# Patient Record
Sex: Female | Born: 1975 | Race: Asian | Hispanic: No | Marital: Married | Smoking: Never smoker
Health system: Southern US, Community
[De-identification: ages and names within clinical notes are randomized; demographics above are authoritative.]

## PROBLEM LIST (undated history)

## (undated) DIAGNOSIS — N200 Calculus of kidney: Secondary | ICD-10-CM

---

## 2007-05-23 ENCOUNTER — Emergency Department: Payer: Self-pay | Admitting: Unknown Physician Specialty

## 2008-09-06 ENCOUNTER — Emergency Department: Payer: Self-pay | Admitting: Emergency Medicine

## 2009-07-20 ENCOUNTER — Emergency Department: Payer: Self-pay | Admitting: Emergency Medicine

## 2009-09-18 ENCOUNTER — Emergency Department: Payer: Self-pay | Admitting: Emergency Medicine

## 2009-11-10 ENCOUNTER — Ambulatory Visit: Payer: Self-pay | Admitting: Urology

## 2010-08-24 ENCOUNTER — Ambulatory Visit: Payer: Self-pay | Admitting: Urology

## 2011-08-30 ENCOUNTER — Ambulatory Visit: Payer: Self-pay | Admitting: Urology

## 2012-05-10 ENCOUNTER — Ambulatory Visit: Payer: Self-pay | Admitting: Urology

## 2013-03-08 ENCOUNTER — Observation Stay: Payer: Self-pay | Admitting: Obstetrics and Gynecology

## 2013-03-09 LAB — URINALYSIS, COMPLETE
Bacteria: NONE SEEN
Bilirubin,UR: NEGATIVE
Ph: 7 (ref 4.5–8.0)
RBC,UR: <1 /HPF (ref 0–5)
Squamous Epithelial: 1
WBC UR: 9 /HPF (ref 0–5)

## 2013-04-01 ENCOUNTER — Inpatient Hospital Stay: Payer: Self-pay

## 2013-04-01 LAB — CBC WITH DIFFERENTIAL/PLATELET
Basophil %: 0.3 %
Eosinophil %: 0.5 %
HCT: 37.8 % (ref 35.0–47.0)
MCH: 29.1 pg (ref 26.0–34.0)
MCHC: 33.6 g/dL (ref 32.0–36.0)
MCV: 87 fL (ref 80–100)
Monocyte #: 0.7 x10 3/mm (ref 0.2–0.9)
Monocyte %: 8.6 %
Neutrophil #: 6.2 10*3/uL (ref 1.4–6.5)
Platelet: 203 10*3/uL (ref 150–440)
WBC: 8.5 10*3/uL (ref 3.6–11.0)

## 2013-04-02 LAB — HEMATOCRIT: HCT: 35.1 % (ref 35.0–47.0)

## 2015-04-20 NOTE — H&P (Signed)
L&D Evaluation:  History Expanded:  HPI 39 yo G1 w estimated date of confinement 5/16/1 at 36 weeks w spontaneous rupture of membranes at 0410 today, mild-medium contractions.  No vb.  [facility]   Gravida 1   Term 0   PreTerm 0   Abortion 0   Living 0   Blood Type (Maternal) B positive   Group B Strep Results Maternal (Result >5wks must be treated as unknown) unknown/result > 5 weeks ago   Presents with leaking fluid   Patient's Medical History No Chronic Illness   Patient's Surgical History none   Medications Pre Natal Vitamins   Allergies NKDA   Social History none   Family History Non-Contributory   ROS:  ROS All systems were reviewed.  HEENT, CNS, GI, GU, Respiratory, CV, Renal and Musculoskeletal systems were found to be normal.   Exam:  Vital Signs stable   General no apparent distress   Mental Status clear   Chest clear   Heart normal sinus rhythm   Abdomen gravid, tender with contractions   Estimated Fetal Weight Average for gestational age   Back no CVAT   Edema no edema   Pelvic no external lesions, 5/100/-1   Mebranes Ruptured   Description clear   FHT normal rate with no decels   Ucx regular   Skin dry   Impression:  Impression early labor, w spontaneous rupture of membranes   Plan:  Plan EFM/NST, monitor contractions and for cervical change, antibiotics for GBBS prophylaxis   Comments Patient has been counseled as to the risks of prematurity, including risks of respiratory depression or distress, jaundice, feeding or temperature regulation problems, neurologic concerns including hearing or visual problems, and brain complications.  Patient understands the risks of tocolytic therapies along with their inherent failure rates, and the reasons for and against their use in individual circumstances.   Electronic Signatures: Letitia LibraHarris, Jastin Fore Paul (MD)  (Signed 22-Apr-14 07:16)  Authored: L&D Evaluation   Last Updated:  22-Apr-14 07:16 by Letitia LibraHarris, Tyquon Near Paul (MD)

## 2015-04-20 NOTE — H&P (Signed)
L&D Evaluation:  History:  HPI 39 [facility] presenting with painless vaginal bleeding that started today.  She felt something wet and noted several small bright red smears of blood.  She was evaluated in clinic approximately 2 weeks ago also noting some dark red bleeding/discharge at that time.   No trauma or preceeding intercourse. She states she has felt intermitten contractions since 03/05/2013.  +FM, no LOF.  PNC noteable for AMA and early entry to care.  PNL B+, ABSC neg, RI, VZI, HIV neg, RPR NR, HBsAg neg, 1-hr Glucola 149 (3-hr with 1 of 4 values abnormal).  20 week anatomy US showing anterior placenta, not low lying.   Presents with vaginal bleeding   Patient's Medical History nephrolithiasis   Patient's Surgical History none   Medications Pre Natal Vitamins   Allergies NKDA   Social History none   Family History Non-Contributory   ROS:  ROS All systems were reviewed.  HEENT, CNS, GI, GU, Respiratory, CV, Renal and Musculoskeletal systems were found to be normal.   Exam:  Vital Signs stable   Urine Protein not completed, UA pending   General no apparent distress   Mental Status clear   Chest no increased work of breathing   Abdomen gravid, non-tender   Estimated Fetal Weight Average for gestational age   Fetal Position vtx   Back no CVAT   Edema no edema   Pelvic no external lesions, cervix closed and thick, dark brown blood in vaginal vault approximately 1 scopets worth, no blood on perineum   Mebranes Intact   FHT normal rate with no decels, reactive tracing   Ucx regular, q642min   Impression:  Impression Vaginal bleeding, contracting at 759w1d   Plan:  Plan UA, EFM/NST, monitor contractions and for cervical change   Comments 1) Contractions - tocolysis with terbutaline 250mcg SQ once.  Will recheck in 1-2hrs.  If any evidence of cervical change will adminsiter BMZ for lung maturity and start ampicillin for GBS unknown.   Given gestational age >32 weeks no magnesium sulfate for CP prophylaxis.  2) Vaginal bleeding - painless, appears to be old blood.  Low concern for abruption, given lack of pain and reassuring fetal surveillance.  Also no evidence of previa on prior US.  I do have some concern that in the setting of contractions this may be secondary to cervical effacement or dilation.  Continuous fetal monitoring.  Will likely keep overnight for prolonged monitoring.   Electronic Signatures: Lorrene ReidStaebler, Dandy Lazaro M (MD)  (Signed 29-Mar-14 23:35)  Authored: L&D Evaluation   Last Updated: 29-Mar-14 23:35 by Lorrene ReidStaebler, Arabell Neria M (MD)

## 2019-12-17 ENCOUNTER — Ambulatory Visit: Payer: BC Managed Care – PPO | Attending: Internal Medicine

## 2019-12-17 DIAGNOSIS — Z20822 Contact with and (suspected) exposure to covid-19: Secondary | ICD-10-CM

## 2019-12-18 LAB — NOVEL CORONAVIRUS, NAA: SARS-CoV-2, NAA: NOT DETECTED

## 2021-05-03 ENCOUNTER — Emergency Department: Payer: BC Managed Care – PPO

## 2021-05-03 ENCOUNTER — Other Ambulatory Visit: Payer: Self-pay

## 2021-05-03 ENCOUNTER — Emergency Department
Admission: EM | Admit: 2021-05-03 | Discharge: 2021-05-03 | Disposition: A | Discharge status: Home / Self Care | Payer: BC Managed Care – PPO | Attending: Emergency Medicine | Admitting: Emergency Medicine

## 2021-05-03 ENCOUNTER — Encounter: Payer: Self-pay | Admitting: Emergency Medicine

## 2021-05-03 DIAGNOSIS — N200 Calculus of kidney: Secondary | ICD-10-CM | POA: Insufficient documentation

## 2021-05-03 DIAGNOSIS — R109 Unspecified abdominal pain: Secondary | ICD-10-CM

## 2021-05-03 HISTORY — DX: Calculus of kidney: N20.0

## 2021-05-03 LAB — COMPREHENSIVE METABOLIC PANEL
ALT: 26 U/L (ref 0–44)
AST: 20 U/L (ref 15–41)
Albumin: 3.7 g/dL (ref 3.5–5.0)
Alkaline Phosphatase: 49 U/L (ref 38–126)
Anion gap: 9 (ref 5–15)
BUN: 11 mg/dL (ref 6–20)
CO2: 22 mmol/L (ref 22–32)
Calcium: 8.8 mg/dL — ABNORMAL LOW (ref 8.9–10.3)
Chloride: 103 mmol/L (ref 98–111)
Creatinine, Ser: 1.53 mg/dL — ABNORMAL HIGH (ref 0.44–1.00)
GFR, Estimated: 43 mL/min — ABNORMAL LOW (ref >60)
Glucose, Bld: 103 mg/dL — ABNORMAL HIGH (ref 70–99)
Potassium: 3.6 mmol/L (ref 3.5–5.1)
Sodium: 134 mmol/L — ABNORMAL LOW (ref 135–145)
Total Bilirubin: 1.1 mg/dL (ref 0.3–1.2)
Total Protein: 7.2 g/dL (ref 6.5–8.1)

## 2021-05-03 LAB — URINALYSIS, COMPLETE (UACMP) WITH MICROSCOPIC
Bilirubin Urine: NEGATIVE
Glucose, UA: NEGATIVE mg/dL
Ketones, ur: 5 mg/dL — AB
Leukocytes,Ua: NEGATIVE
Nitrite: NEGATIVE
Protein, ur: NEGATIVE mg/dL
Specific Gravity, Urine: 1.004 — ABNORMAL LOW (ref 1.005–1.030)
pH: 6 (ref 5.0–8.0)

## 2021-05-03 LAB — CBC
HCT: 39.9 % (ref 36.0–46.0)
Hemoglobin: 13.7 g/dL (ref 12.0–15.0)
MCH: 29.1 pg (ref 26.0–34.0)
MCHC: 34.3 g/dL (ref 30.0–36.0)
MCV: 84.7 fL (ref 80.0–100.0)
Platelets: 264 10*3/uL (ref 150–400)
RBC: 4.71 MIL/uL (ref 3.87–5.11)
RDW: 12.8 % (ref 11.5–15.5)
WBC: 13.5 10*3/uL — ABNORMAL HIGH (ref 4.0–10.5)
nRBC: 0 % (ref 0.0–0.2)

## 2021-05-03 LAB — LIPASE, BLOOD: Lipase: 41 U/L (ref 11–51)

## 2021-05-03 LAB — POC URINE PREG, ED: Preg Test, Ur: NEGATIVE

## 2021-05-03 MED ORDER — ONDANSETRON 4 MG PO TBDP: 4.0000 mg | ORAL_TABLET | Freq: Three times a day (TID) | ORAL | 0 refills | Status: DC | PRN

## 2021-05-03 MED ORDER — SODIUM CHLORIDE 0.9 % IV BOLUS
1000.0000 mL | Freq: Once | INTRAVENOUS | Status: AC
  Administered 2021-05-03: 1000 mL via INTRAVENOUS

## 2021-05-03 MED ORDER — ONDANSETRON 4 MG PO TBDP
ORAL_TABLET | ORAL | Status: AC
  Filled 2021-05-03: qty 1

## 2021-05-03 MED ORDER — ONDANSETRON HCL 4 MG/2ML IJ SOLN
INTRAMUSCULAR | Status: AC
  Administered 2021-05-03: 4 mg via INTRAVENOUS
  Filled 2021-05-03: qty 2

## 2021-05-03 MED ORDER — FENTANYL CITRATE (PF) 100 MCG/2ML IJ SOLN
INTRAMUSCULAR | Status: AC
  Administered 2021-05-03: 50 ug via INTRAVENOUS
  Filled 2021-05-03: qty 2

## 2021-05-03 MED ORDER — OXYCODONE-ACETAMINOPHEN 5-325 MG PO TABS
1.0000 | ORAL_TABLET | Freq: Once | ORAL | Status: AC
  Administered 2021-05-03: 1 via ORAL
  Filled 2021-05-03: qty 1

## 2021-05-03 MED ORDER — FENTANYL CITRATE (PF) 100 MCG/2ML IJ SOLN
50.0000 ug | INTRAMUSCULAR | Status: AC | PRN
Start: 2021-05-03 — End: 2021-05-03
  Administered 2021-05-03: 50 ug via INTRAVENOUS

## 2021-05-03 MED ORDER — OXYCODONE-ACETAMINOPHEN 5-325 MG PO TABS: 1.0000 | ORAL_TABLET | ORAL | 0 refills | Status: DC | PRN

## 2021-05-03 MED ORDER — ONDANSETRON 4 MG PO TBDP
4.0000 mg | ORAL_TABLET | Freq: Once | ORAL | Status: AC
  Administered 2021-05-03: 4 mg via ORAL

## 2021-05-03 MED ORDER — ONDANSETRON HCL 4 MG/2ML IJ SOLN: 4.0000 mg | Freq: Once | INTRAMUSCULAR | Status: AC

## 2021-05-03 NOTE — ED Triage Notes (Signed)
C/O left flank pain since last night.  States pain left flank to LLQ and left lower back.  Denies dysuria.  Also c/o nausea

## 2021-05-03 NOTE — ED Provider Notes (Signed)
[facility] Emergency Department Provider Note   ____________________________________________   Event Date/Time   First MD Initiated Contact with Patient 05/03/21 1803     (approximate)  I have reviewed the triage vital signs and the nursing notes.   HISTORY  Chief Complaint Flank Pain    HPI Nichole Tapia is a 45 y.o. female with past medical history of kidney stones who presents to the ED complaining of flank pain.  Patient reports that she developed sharp pain in her left flank yesterday morning that has been present intermittently since then.  It radiates towards the left lower quadrant of her abdomen but is not exacerbated or alleviated by anything in particular.  She has felt nauseous and vomited a couple of times but denies any changes in her bowel movements.  She has not had any fevers, dysuria, or hematuria.  She states she was diagnosed with kidney stones many years ago but has not had any issues since then.  She has been taking ibuprofen with partial relief of her pain.        Past Medical History:  Diagnosis Date  . Kidney stones     There are no problems to display for this patient.   History reviewed. No pertinent surgical history.  Prior to Admission medications   Not on File    Allergies Amoxicillin  No family history on file.  Social History    Review of Systems  Constitutional: No fever/chills Eyes: No visual changes. ENT: No sore throat. Cardiovascular: Denies chest pain. Respiratory: Denies shortness of breath. Gastrointestinal: Positive for flank and abdominal pain.  Positive for nausea and vomiting.  No diarrhea.  No constipation. Genitourinary: Negative for dysuria. Musculoskeletal: Negative for back pain. Skin: Negative for rash. Neurological: Negative for headaches, focal weakness or numbness.  ____________________________________________   PHYSICAL EXAM:  VITAL SIGNS: ED Triage Vitals  Enc Vitals  Group     BP 05/03/21 1447 102/78     Pulse Rate 05/03/21 1447 93     Resp 05/03/21 1447 16     Temp 05/03/21 1447 98.7 F (37.1 C)     Temp Source 05/03/21 1447 Oral     SpO2 05/03/21 1447 100 %     Weight 05/03/21 1446 153 lb (69.4 kg)     Height 05/03/21 1446 5' (1.524 m)     Head Circumference --      Peak Flow --      Pain Score 05/03/21 1445 9     Pain Loc --      Pain Edu? --      Excl. in GC? --     Constitutional: Alert and oriented. Eyes: Conjunctivae are normal. Head: Atraumatic. Nose: No congestion/rhinnorhea. Mouth/Throat: Mucous membranes are moist. Neck: Normal ROM Cardiovascular: Normal rate, regular rhythm. Grossly normal heart sounds. Respiratory: Normal respiratory effort.  No retractions. Lungs CTAB. Gastrointestinal: Soft and tender to palpation in the left lower quadrant with no rebound or guarding.  No CVA tenderness bilaterally. No distention. Genitourinary: deferred Musculoskeletal: No lower extremity tenderness nor edema. Neurologic:  Normal speech and language. No gross focal neurologic deficits are appreciated. Skin:  Skin is warm, dry and intact. No rash noted. Psychiatric: Mood and affect are normal. Speech and behavior are normal.  ____________________________________________   LABS (all labs ordered are listed, but only abnormal results are displayed)  Labs Reviewed  COMPREHENSIVE METABOLIC PANEL - Abnormal; Notable for the following components:      Result Value  Sodium 134 (*)    Glucose, Bld 103 (*)    Creatinine, Ser 1.53 (*)    Calcium 8.8 (*)    GFR, Estimated 43 (*)    All other components within normal limits  CBC - Abnormal; Notable for the following components:   WBC 13.5 (*)    All other components within normal limits  URINALYSIS, COMPLETE (UACMP) WITH MICROSCOPIC - Abnormal; Notable for the following components:   Color, Urine STRAW (*)    APPearance CLEAR (*)    Specific Gravity, Urine 1.004 (*)    Hgb urine dipstick  MODERATE (*)    Ketones, ur 5 (*)    Bacteria, UA RARE (*)    All other components within normal limits  LIPASE, BLOOD  POC URINE PREG, ED     PROCEDURES  Procedure(s) performed (including Critical Care):  Procedures   ____________________________________________   INITIAL IMPRESSION / ASSESSMENT AND PLAN / ED COURSE       45 year old female with past medical history of kidney stones presents to the ED with intermittent left flank and left lower quadrant abdominal pain since yesterday morning.  CT scan performed from triage and is significant for 5 mm ureterolithiasis with associated hydronephrosis.  There is additionally an ovarian cyst noted that patient states she was previously aware of and currently follows with OB/GYN for monitoring of.  UA shows no signs of infection and patient reports pain and nausea are improved following fentanyl and Zofran.  She does have a mild AKI, unclear if this is related to ureterolithiasis given right ureter is unobstructed.  We will hydrate with IV fluids and give additional dose of pain medication.  Patient is appropriate for discharge home with urology follow-up as needed, was counseled to return to the ED for new worsening symptoms, patient agrees with plan.      ____________________________________________   FINAL CLINICAL IMPRESSION(S) / ED DIAGNOSES  Final diagnoses:  Kidney stone  Left flank pain     ED Discharge Orders    None       Note:  This document was prepared using Dragon voice recognition software and may include unintentional dictation errors.   Chesley Noon, MD 05/03/21 [phone]

## 2021-05-10 ENCOUNTER — Ambulatory Visit (INDEPENDENT_AMBULATORY_CARE_PROVIDER_SITE_OTHER): Payer: BC Managed Care – PPO | Admitting: Urology

## 2021-05-10 ENCOUNTER — Encounter: Payer: Self-pay | Admitting: Urology

## 2021-05-10 ENCOUNTER — Other Ambulatory Visit: Payer: Self-pay

## 2021-05-10 VITALS — BP 106/73 | HR 83 | Ht 60.0 in | Wt 153.0 lb

## 2021-05-10 DIAGNOSIS — R3129 Other microscopic hematuria: Secondary | ICD-10-CM | POA: Diagnosis not present

## 2021-05-10 DIAGNOSIS — N200 Calculus of kidney: Secondary | ICD-10-CM | POA: Diagnosis not present

## 2021-05-10 NOTE — Progress Notes (Signed)
05/10/2021 11:51 AM   Nichole Tapia 01-Jun-1976 [id number]  Referring provider: Barbette Reichmann, MD [address] [facility] [place],  [place] [address]  Chief Complaint  Patient presents with  . Nephrolithiasis    HPI: 45 year old female with a long history of nephrolithiasis who presents today to establish care.  She was recently seen on May 03, 2021 for an acute stone episode with sudden onset severe flank pain.  She was diagnosed with a 5 mm left mid ureteral calculus.  There is no other evidence of infection and she was advised to have outpatient urologic follow-up.  She brings a stone with her today which she just passed yesterday.  Her pain is all resolved.  She does have some residual microscopic hematuria, 3-10 red blood cells per high-powered field after having just passed the stone.  She has no other complaints.  She reports that she was previously followed by Dr. Achilles Dunk in the remote past.  She is not seen a urologist in quite some time.  Has not had a stone episode in greater than 10 years.  I attempted to review records in care everywhere however they were unavailable.  She recalls her previous stones were calcium oxalate.  She does have some nonobstructing smaller stones bilaterally.  She recalls having had these in the past.  In terms of dietary issues, she is a vegetarian and does eat a lot of leafy greens.  She definitely does not drink enough water per her own report.   PMH: Past Medical History:  Diagnosis Date  . Kidney stones     Surgical History: No past surgical history on file.  Home Medications:  Allergies as of 05/10/2021      Reactions   Pineapple Rash   Amoxicillin Rash      Medication List       Accurate as of May 10, 2021 11:51 AM. If you have any questions, ask your nurse or doctor.        STOP taking these medications   oxyCODONE-acetaminophen 5-325 MG tablet Commonly known as: Percocet Stopped by: Vanna Scotland, MD     TAKE these medications   ondansetron 4 MG disintegrating tablet Commonly known as: Zofran ODT Take 1 tablet (4 mg total) by mouth every 8 (eight) hours as needed for nausea or vomiting.       Allergies:  Allergies  Allergen Reactions  . Pineapple Rash  . Amoxicillin Rash    Family History: No family history on file.  Social History:  reports that she has never smoked. She has never used smokeless tobacco. She reports previous alcohol use. No history on file for drug use.   Physical Exam: BP 106/73   Pulse 83   Ht 5' (1.524 m)   Wt 153 lb (69.4 kg)   BMI 29.88 kg/m   Constitutional:  Alert and oriented, No acute distress. HEENT: [place] AT, moist mucus membranes.  Trachea midline, no masses. Cardiovascular: No clubbing, cyanosis, or edema. Respiratory: Normal respiratory effort, no increased work of breathing. Skin: No rashes, bruises or suspicious lesions. Neurologic: Grossly intact, no focal deficits, moving all 4 extremities. Psychiatric: Normal mood and affect.  Laboratory Data: Lab Results  Component Value Date   WBC 13.5 (H) 05/03/2021   HGB 13.7 05/03/2021   HCT 39.9 05/03/2021   MCV 84.7 05/03/2021   PLT 264 05/03/2021    Lab Results  Component Value Date   CREATININE 1.53 (H) 05/03/2021    Urinalysis 3-10  red blood cells per high-powered field  Pertinent Imaging:  CT Renal Stone Study  Narrative CLINICAL DATA:  Left flank pain.  EXAM: CT ABDOMEN AND PELVIS WITHOUT CONTRAST  TECHNIQUE: Multidetector CT imaging of the abdomen and pelvis was performed following the standard protocol without IV contrast.  COMPARISON:  Remote CT 09/18/2009  FINDINGS: Lower chest: The lung bases are clear without focal airspace disease or pleural effusion. Heart is normal in size.  Hepatobiliary: Diffusely decreased hepatic density consistent with steatosis. No focal lesion on noncontrast exam. Gallbladder  Pancreas: No ductal dilatation or  inflammation.  Spleen: Normal in size without focal abnormality.  Adrenals/Urinary Tract: Normal adrenal glands.  Obstructing 5 x 5 mm stone in the left mid ureter (at the level of L3-L4) with mild proximal hydroureteronephrosis. More distal ureters decompressed. Multiple, at least 4, additional nonobstructing intrarenal stones on the left. No right hydronephrosis. There are at least 3 nonobstructing right intrarenal calculi. Right ureter is decompressed without stone. Minimally distended urinary bladder. No bladder stone.  Stomach/Bowel: Stomach is within normal limits. Appendix appears normal. No evidence of bowel wall thickening, distention, or inflammatory changes.  Vascular/Lymphatic: Normal caliber abdominal aorta. No bulky abdominopelvic adenopathy.  Reproductive: Septated cyst versus 2 adjacent cysts in the left adnexa measure 5.1 cm. Unremarkable unenhanced appearance of the uterus. Right ovary is grossly normal. Small amount of free fluid in the pelvis.  Other: Small amount of pelvic free fluid. No upper abdominal ascites. No free air. Small fat containing umbilical hernia.  Musculoskeletal: Punctate bone island in the right femoral head. Mild sclerosis about the iliac aspect of the sacroiliac joints. There are no acute or suspicious osseous abnormalities.  IMPRESSION: 1. Obstructing 5 x 5 mm stone in the left mid ureter with mild hydronephrosis. 2. Additional nonobstructing stones in both kidneys. 3. Septated cyst versus 2 adjacent cysts in the left adnexa measure 5.1 cm. Because this lesion is not adequately characterized, prompt [place] is recommended for further evaluation. Note: This recommendation does not apply to premenarchal patients and to those with increased risk (genetic, family history, elevated tumor markers or other high-risk factors) of ovarian cancer. Reference: JACR 2020 Feb; 17(2):248-254 4. Hepatic steatosis.   Electronically Signed By:  Narda Rutherford M.D. On: 05/03/2021 17:38  Above CT scan images were personally reviewed today.  Agree with radiologic interpretation.  Assessment & Plan:    \1. Kidney stones Interval passage of 5 mm stone visually confirmed today  Offered to repeat stone analysis, interested the stone sent for metabolic evaluation  Given her fairly infrequent stone episodes in the recent past, we elected to defer 24-hour urine metabolic evaluation  Given the relatively small size of her nonobstructing stones, would recommend observation, plan for KUB in 1 year or sooner as needed  We discussed general stone prevention techniques including drinking plenty water with goal of producing 2.5 L urine daily, increased citric acid intake, avoidance of high oxalate containing foods, and decreased salt intake.  Information about dietary recommendations given today. - Urinalysis, Complete - Abdomen 1 view (KUB); Future - Calculi, with Photograph  2. Microscopic hematuria Likely secondary to #1, will recheck urinalysis next year  Follow-up in 1 year with KUB/UA  Vanna Scotland, MD  [facility] [address] [place], [place] [address] [phone]

## 2021-05-11 LAB — MICROSCOPIC EXAMINATION: Bacteria, UA: NONE SEEN

## 2021-05-11 LAB — URINALYSIS, COMPLETE
Bilirubin, UA: NEGATIVE
Glucose, UA: NEGATIVE
Ketones, UA: NEGATIVE
Nitrite, UA: NEGATIVE
Protein,UA: NEGATIVE
Specific Gravity, UA: 1.02 (ref 1.005–1.030)
Urobilinogen, Ur: 0.2 mg/dL (ref 0.2–1.0)
pH, UA: 7 (ref 5.0–7.5)

## 2021-05-14 LAB — CALCULI, WITH PHOTOGRAPH (CLINICAL LAB)
Calcium Oxalate Monohydrate: 100 %
Weight Calculi: 59 mg

## 2022-05-10 ENCOUNTER — Ambulatory Visit: Payer: Self-pay | Admitting: Urology

## 2022-08-27 IMAGING — CT CT RENAL STONE PROTOCOL
3 of 4 series · 10 of 46 positions shown, 15 images · non-contrast
Comparison: Remote CT 09/18/2009

CLINICAL DATA: Left flank pain.

EXAM:
CT ABDOMEN AND PELVIS WITHOUT CONTRAST
TECHNIQUE: Multidetector CT imaging of the abdomen and pelvis was performed
following the standard protocol without IV contrast.

[Series 4: lung bases · axial · 0.72mm/px · z∈[-119,-14]mm · 6 of 31 slices shown, 11 images]
[im 5/31  soft-tissue]
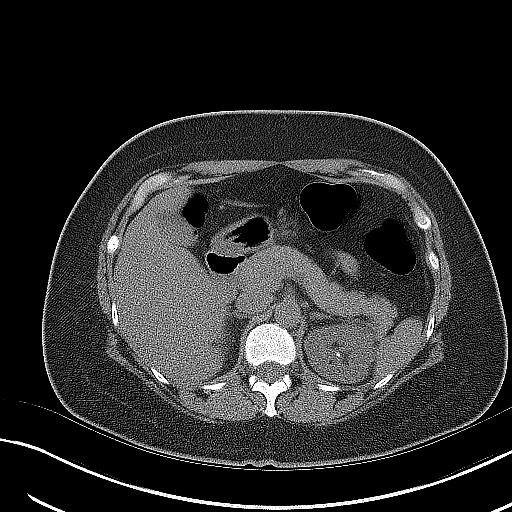
[im 5/31  bone]
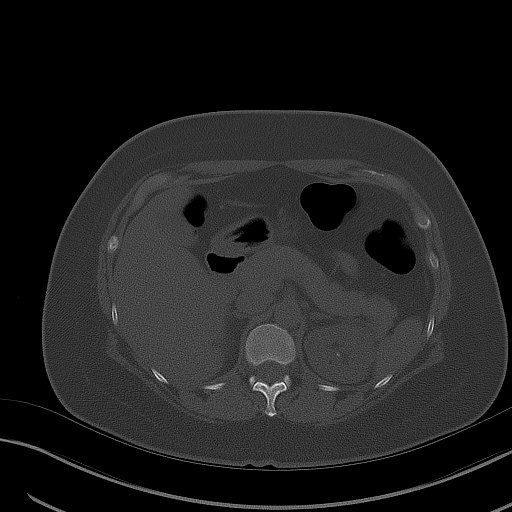
[im 9/31  soft-tissue]
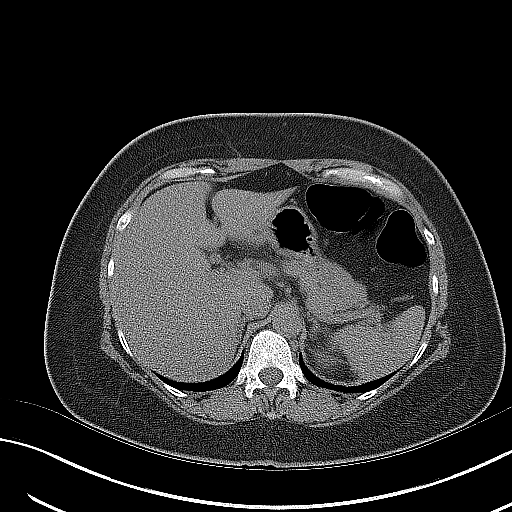
[im 13/31  soft-tissue]
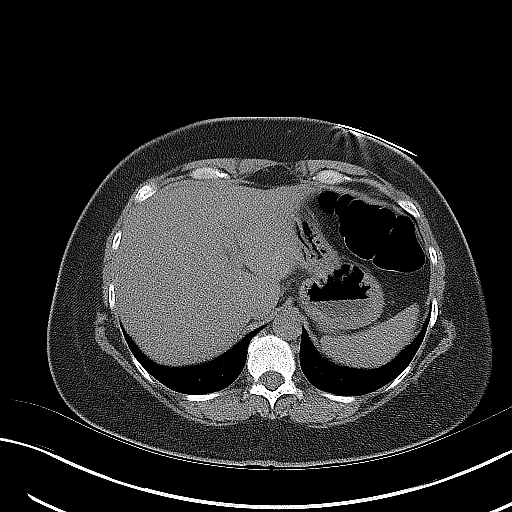
[im 13/31  lung]
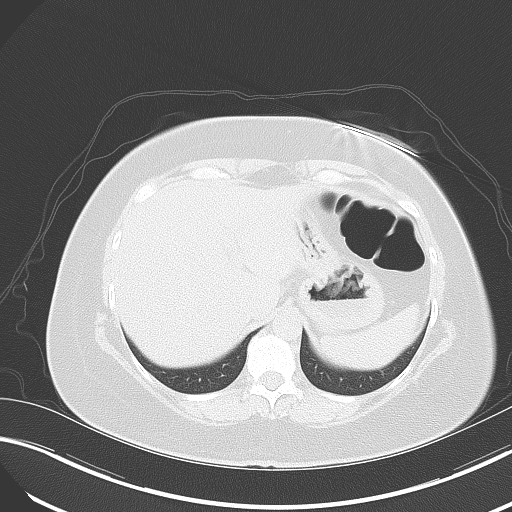
[im 18/31  soft-tissue]
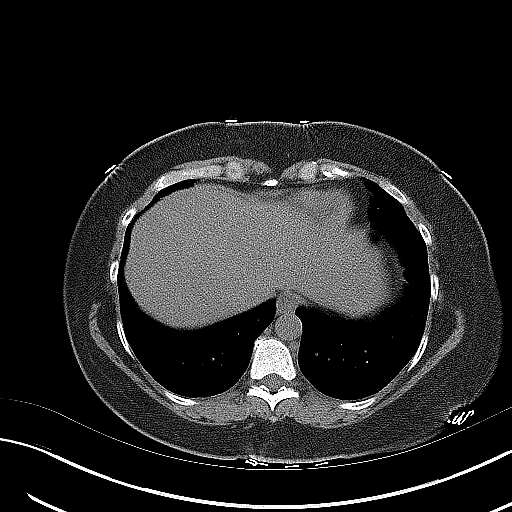
[im 18/31  lung]
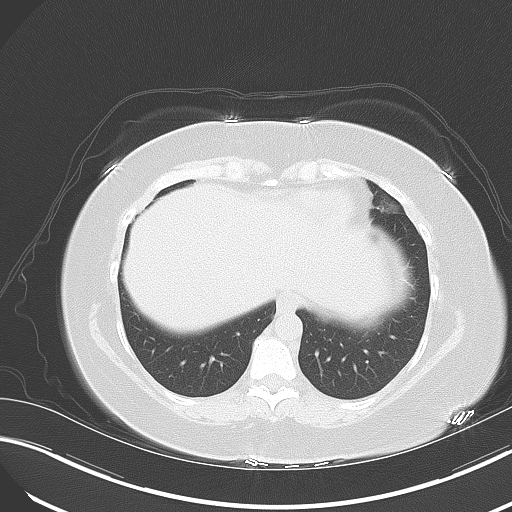
[im 22/31  soft-tissue]
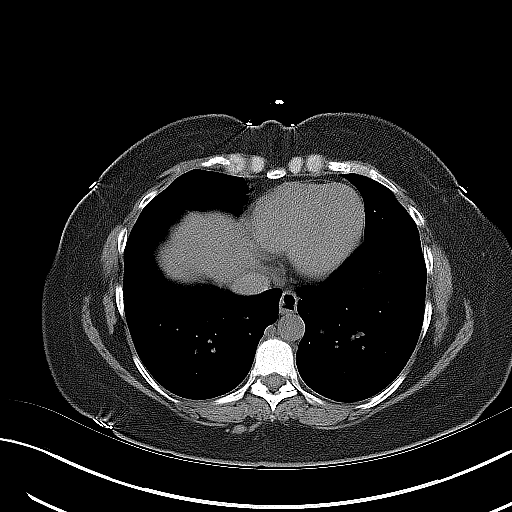
[im 22/31  lung]
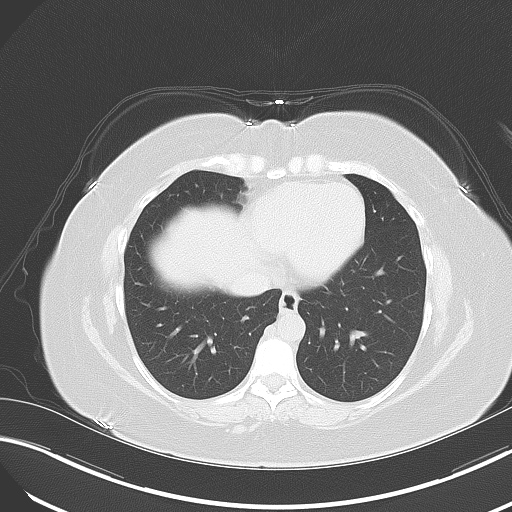
[im 26/31  soft-tissue]
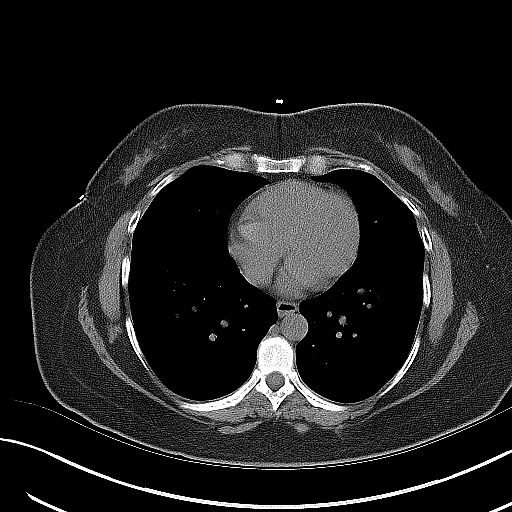
[im 26/31  lung]
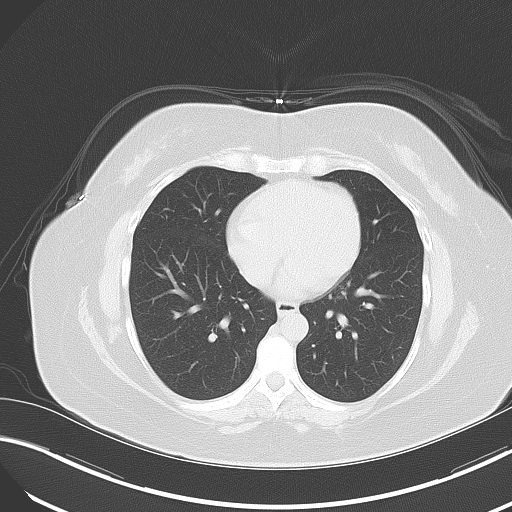

[Series 5: coronal · coronal · 0.78mm/px · 3 of 135 slices shown]
[im 45/135  soft-tissue]
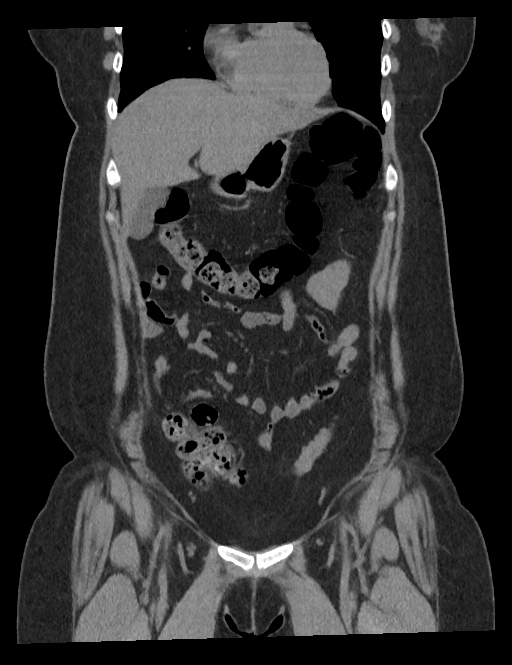
[im 60/135  soft-tissue]
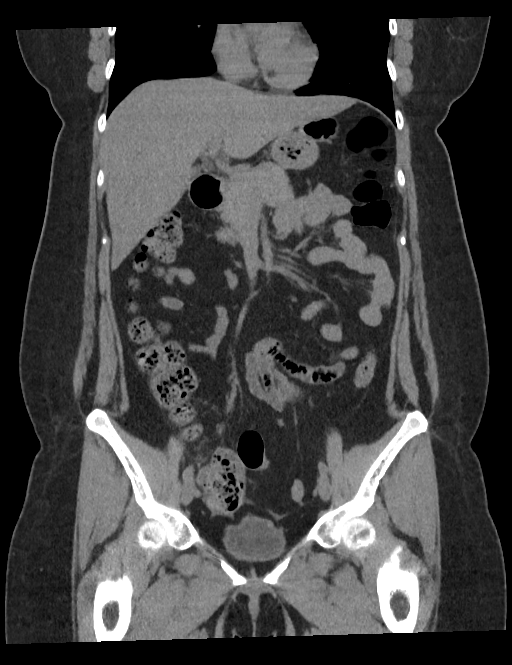
[im 75/135  soft-tissue]
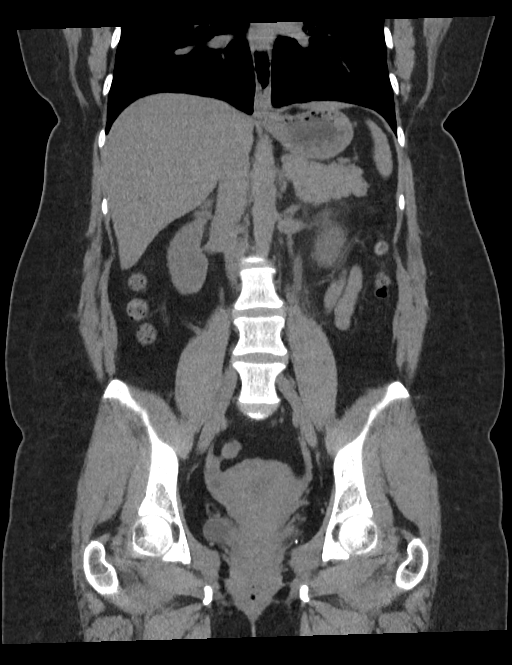

[Series 6: sagittal · sagittal · 0.60mm/px · 1 of 150 slices shown]
[im 50/150  soft-tissue]
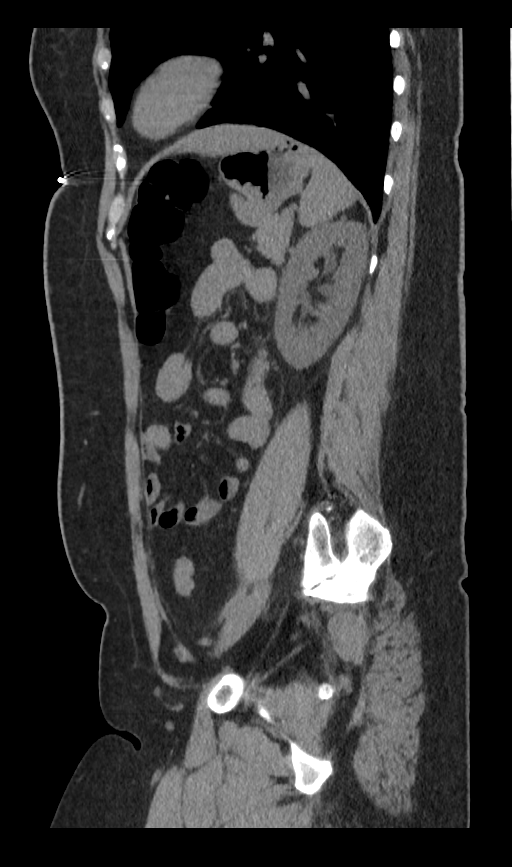

[10 of 46 positions shown; findings below may reference images not displayed]

FINDINGS: Lower chest: The lung bases are clear without focal airspace disease
or pleural effusion. Heart is normal in size.

Hepatobiliary: Diffusely decreased hepatic density consistent with
steatosis. No focal lesion on noncontrast exam. Gallbladder

Pancreas: No ductal dilatation or inflammation.

Spleen: Normal in size without focal abnormality.

Adrenals/Urinary Tract: Normal adrenal glands.

Obstructing 5 x 5 mm stone in the left mid ureter (at the level of
L3-L4) with mild proximal hydroureteronephrosis. More distal ureters
decompressed. Multiple, at least 4, additional nonobstructing
intrarenal stones on the left. No right hydronephrosis. There are at
least 3 nonobstructing right intrarenal calculi. Right ureter is
decompressed without stone. Minimally distended urinary bladder. No
bladder stone.

Stomach/Bowel: Stomach is within normal limits. Appendix appears
normal. No evidence of bowel wall thickening, distention, or
inflammatory changes.

Vascular/Lymphatic: Normal caliber abdominal aorta. No bulky
abdominopelvic adenopathy.

Reproductive: Septated cyst versus 2 adjacent cysts in the left
adnexa measure 5.1 cm. Unremarkable unenhanced appearance of the
uterus. Right ovary is grossly normal. Small amount of free fluid in
the pelvis.

Other: Small amount of pelvic free fluid. No upper abdominal
ascites. No free air. Small fat containing umbilical hernia.

Musculoskeletal: Punctate bone island in the right femoral head.
Mild sclerosis about the iliac aspect of the sacroiliac joints.
There are no acute or suspicious osseous abnormalities.
IMPRESSION: 1. Obstructing 5 x 5 mm stone in the left mid ureter with mild
hydronephrosis.
2. Additional nonobstructing stones in both kidneys.
3. Septated cyst versus 2 adjacent cysts in the left adnexa measure
5.1 cm. Because this lesion is not adequately characterized, prompt
US is recommended for further evaluation. Note: This recommendation
does not apply to premenarchal patients and to those with increased
risk (genetic, family history, elevated tumor markers or other
high-risk factors) of ovarian cancer. Reference: JACR [DATE]. Hepatic steatosis.

## 2023-01-04 ENCOUNTER — Other Ambulatory Visit: Payer: Self-pay | Admitting: Internal Medicine

## 2023-01-04 DIAGNOSIS — Z1231 Encounter for screening mammogram for malignant neoplasm of breast: Secondary | ICD-10-CM

## 2023-01-24 ENCOUNTER — Ambulatory Visit
Admission: RE | Admit: 2023-01-24 | Discharge: 2023-01-24 | Disposition: A | Discharge status: Home / Self Care | Payer: Managed Care, Other (non HMO) | Source: Ambulatory Visit | Attending: Internal Medicine | Admitting: Internal Medicine

## 2023-01-24 DIAGNOSIS — Z1231 Encounter for screening mammogram for malignant neoplasm of breast: Secondary | ICD-10-CM | POA: Insufficient documentation

## 2024-04-08 ENCOUNTER — Other Ambulatory Visit: Payer: Self-pay | Admitting: Internal Medicine

## 2024-04-08 ENCOUNTER — Ambulatory Visit
Admission: RE | Admit: 2024-04-08 | Discharge: 2024-04-08 | Disposition: A | Discharge status: Home / Self Care | Source: Ambulatory Visit | Attending: Internal Medicine | Admitting: Internal Medicine

## 2024-04-08 DIAGNOSIS — R1031 Right lower quadrant pain: Secondary | ICD-10-CM | POA: Insufficient documentation

## 2024-04-08 DIAGNOSIS — Z87442 Personal history of urinary calculi: Secondary | ICD-10-CM | POA: Diagnosis present

## 2024-04-08 MED ORDER — IOHEXOL 300 MG/ML  SOLN
100.0000 mL | Freq: Once | INTRAMUSCULAR | Status: AC | PRN
  Administered 2024-04-08: 100 mL via INTRAVENOUS

## 2024-04-21 ENCOUNTER — Ambulatory Visit (INDEPENDENT_AMBULATORY_CARE_PROVIDER_SITE_OTHER): Admitting: Physician Assistant

## 2024-04-21 ENCOUNTER — Encounter: Payer: Self-pay | Admitting: Physician Assistant

## 2024-04-21 VITALS — BP 109/76 | HR 89 | Ht 60.0 in | Wt 163.0 lb

## 2024-04-21 DIAGNOSIS — N201 Calculus of ureter: Secondary | ICD-10-CM

## 2024-04-21 LAB — MICROSCOPIC EXAMINATION: Epithelial Cells (non renal): >10 /HPF — AB (ref 0–10)

## 2024-04-21 LAB — URINALYSIS, COMPLETE
Bilirubin, UA: NEGATIVE
Glucose, UA: NEGATIVE
Ketones, UA: NEGATIVE
Nitrite, UA: NEGATIVE
Protein,UA: NEGATIVE
Specific Gravity, UA: 1.025 (ref 1.005–1.030)
Urobilinogen, Ur: 0.2 mg/dL (ref 0.2–1.0)
pH, UA: 6 (ref 5.0–7.5)

## 2024-04-21 MED ORDER — SULFAMETHOXAZOLE-TRIMETHOPRIM 800-160 MG PO TABS: 1.0000 | ORAL_TABLET | Freq: Two times a day (BID) | ORAL | 0 refills | Status: AC

## 2024-04-21 MED ORDER — OXYCODONE-ACETAMINOPHEN 5-325 MG PO TABS: 1.0000 | ORAL_TABLET | Freq: Four times a day (QID) | ORAL | 0 refills | Status: AC | PRN

## 2024-04-21 MED ORDER — ONDANSETRON 4 MG PO TBDP: 4.0000 mg | ORAL_TABLET | Freq: Three times a day (TID) | ORAL | 0 refills | Status: AC | PRN

## 2024-04-21 MED ORDER — TAMSULOSIN HCL 0.4 MG PO CAPS: 0.4000 mg | ORAL_CAPSULE | ORAL | 0 refills | Status: DC | PRN

## 2024-04-21 NOTE — Progress Notes (Signed)
 04/21/2024 12:10 PM   Cato Cockayne 03/26/1976 [id number]  CC: Chief Complaint  Patient presents with   Abdominal Pain   HPI: Nichole Tapia is a 48 y.o. female with PMH nephrolithiasis who presents today for evaluation of a possible acute stone episode.   Today she reports right flank pain about 2 weeks ago. Dr. Kevan Peers ordered a CTAP with contrast, which showed mild right hydronephrosis to the level of a 3 mm distal right ureteral stone.  UA at the time was rather bland with 6 WBC/hpf, no microscopic hematuria, and no bacteriuria. She took some leftover oxycodone  and her pain has since resolved.  She never saw stone pass.  In-office UA today positive for clarity urine, 2+ blood, and trace leukocytes; urine microscopy with 11-30 WBCs/HPF, 3-10 RBCs/HPF, >10 epithelial cells/hpf, and many bacteria.    PMH: Past Medical History:  Diagnosis Date   Kidney stones     Surgical History: No past surgical history on file.  Home Medications:  Allergies as of 04/21/2024       Reactions   Pineapple Rash   Amoxicillin Rash        Medication List        Accurate as of Apr 21, 2024 12:10 PM. If you have any questions, ask your nurse or doctor.          ondansetron  4 MG disintegrating tablet Commonly known as: ZOFRAN -ODT Take 1 tablet (4 mg total) by mouth every 8 (eight) hours as needed for nausea or vomiting.   oxyCODONE -acetaminophen  5-325 MG tablet Commonly known as: PERCOCET/ROXICET Take 1-2 tablets by mouth every 6 (six) hours as needed for severe pain (pain score 7-10). Started by: Yosgart Pavey   sulfamethoxazole-trimethoprim 800-160 MG tablet Commonly known as: BACTRIM DS Take 1 tablet by mouth 2 (two) times daily for 5 days. Started by: Kathreen Pare   tamsulosin 0.4 MG Caps capsule Commonly known as: FLOMAX Take 1 capsule (0.4 mg total) by mouth as needed (kidney stone episodes). Started by: Heliodoro Domagalski        Allergies:   Allergies  Allergen Reactions   Pineapple Rash   Amoxicillin Rash    Family History: Family History  Problem Relation Age of Onset   Breast cancer Neg Hx     Social History:   reports that she has never smoked. She has never used smokeless tobacco. She reports that she does not currently use alcohol. No history on file for drug use.  Physical Exam: BP 109/76   Pulse 89   Ht 5' (1.524 m)   Wt 163 lb (73.9 kg)   BMI 31.83 kg/m   Constitutional:  Alert and oriented, no acute distress, nontoxic appearing HEENT: [place], AT Cardiovascular: No clubbing, cyanosis, or edema Respiratory: Normal respiratory effort, no increased work of breathing Lymph: No cervical or inguinal lymphadenopathy Skin: No rashes, bruises or suspicious lesions Neurologic: Grossly intact, no focal deficits, moving all 4 extremities Psychiatric: Normal mood and affect  Laboratory Data: Results for orders placed or performed in visit on 04/21/24  Microscopic Examination   Collection Time: 04/21/24  9:12 AM   Urine  Result Value Ref Range   WBC, UA 11-30 (A) 0 - 5 /hpf   RBC, Urine 3-10 (A) 0 - 2 /hpf   Epithelial Cells (non renal) >10 (A) 0 - 10 /hpf   Mucus, UA Present (A) Not Estab.   Bacteria, UA Many (A) None seen/Few  Urinalysis, Complete   Collection Time: 04/21/24  9:12  AM  Result Value Ref Range   Specific Gravity, UA 1.025 1.005 - 1.030   pH, UA 6.0 5.0 - 7.5   Color, UA Yellow Yellow   Appearance Ur Slightly cloudy Clear   Leukocytes,UA Trace (A) Negative   Protein,UA Negative Negative/Trace   Glucose, UA Negative Negative   Ketones, UA Negative Negative   RBC, UA 2+ (A) Negative   Bilirubin, UA Negative Negative   Urobilinogen, Ur 0.2 0.2 - 1.0 mg/dL   Nitrite, UA Negative Negative   Microscopic Examination See below:    Assessment & Plan:   1. Right ureteral stone (Primary) UA is suspicious for UTI, however she is no longer symptomatic of her stone.  Will start empiric Bactrim and  send for culture for further evaluation.  She prefers to defer additional imaging at this time, which is reasonable since I suspect she has spontaneously passed the small right ureteral stone.  Will plan to recheck her UA in 2 weeks.  If still positive, we agreed to pursue additional imaging, or sooner if her pain recurs.  I am sending in Flomax, Zofran , and Percocet for her to have on hand for future stone episodes as well. - Urinalysis, Complete - CULTURE, URINE COMPREHENSIVE - sulfamethoxazole-trimethoprim (BACTRIM DS) 800-160 MG tablet; Take 1 tablet by mouth 2 (two) times daily for 5 days.  Dispense: 10 tablet; Refill: 0 - tamsulosin (FLOMAX) 0.4 MG CAPS capsule; Take 1 capsule (0.4 mg total) by mouth as needed (kidney stone episodes).  Dispense: 30 capsule; Refill: 0 - ondansetron  (ZOFRAN -ODT) 4 MG disintegrating tablet; Take 1 tablet (4 mg total) by mouth every 8 (eight) hours as needed for nausea or vomiting.  Dispense: 20 tablet; Refill: 0 - oxyCODONE -acetaminophen  (PERCOCET/ROXICET) 5-325 MG tablet; Take 1-2 tablets by mouth every 6 (six) hours as needed for severe pain (pain score 7-10).  Dispense: 10 tablet; Refill: 0   Return in about 2 weeks (around 05/05/2024) for Lab visit for UA.  Kathreen Pare, PA-C  [facility] [place] [address] [place], [place] [address] [phone]

## 2024-04-24 ENCOUNTER — Ambulatory Visit: Payer: Self-pay | Admitting: Physician Assistant

## 2024-04-24 LAB — CULTURE, URINE COMPREHENSIVE

## 2024-04-28 MED ORDER — CIPROFLOXACIN HCL 250 MG PO TABS: 250.0000 mg | ORAL_TABLET | Freq: Two times a day (BID) | ORAL | 0 refills | Status: AC

## 2024-05-02 ENCOUNTER — Other Ambulatory Visit: Payer: Self-pay

## 2024-05-02 DIAGNOSIS — N201 Calculus of ureter: Secondary | ICD-10-CM

## 2024-05-06 ENCOUNTER — Ambulatory Visit: Payer: Self-pay | Admitting: Physician Assistant

## 2024-05-06 ENCOUNTER — Other Ambulatory Visit

## 2024-05-06 DIAGNOSIS — N201 Calculus of ureter: Secondary | ICD-10-CM

## 2024-05-06 LAB — URINALYSIS, COMPLETE
Bilirubin, UA: NEGATIVE
Glucose, UA: NEGATIVE
Ketones, UA: NEGATIVE
Nitrite, UA: NEGATIVE
Protein,UA: NEGATIVE
Specific Gravity, UA: 1.01 (ref 1.005–1.030)
Urobilinogen, Ur: 0.2 mg/dL (ref 0.2–1.0)
pH, UA: 6.5 (ref 5.0–7.5)

## 2024-05-06 LAB — MICROSCOPIC EXAMINATION: Epithelial Cells (non renal): >10 /HPF — AB (ref 0–10)

## 2024-05-08 LAB — CULTURE, URINE COMPREHENSIVE

## 2024-05-13 ENCOUNTER — Other Ambulatory Visit: Payer: Self-pay | Admitting: Physician Assistant

## 2024-05-13 DIAGNOSIS — N201 Calculus of ureter: Secondary | ICD-10-CM

## 2024-05-27 ENCOUNTER — Telehealth: Payer: Self-pay

## 2024-05-27 NOTE — Telephone Encounter (Signed)
 Pt states she will call to schedule an appointment in 2 weeks  for a Cath UA with Sam
